# Patient Record
Sex: Female | Born: 1985 | Hispanic: Yes | Marital: Single | Smoking: Current some day smoker
Health system: Southern US, Community
[De-identification: ages and names within clinical notes are randomized; demographics above are authoritative.]

---

## 2017-07-23 ENCOUNTER — Encounter (HOSPITAL_COMMUNITY): Payer: Self-pay | Admitting: Emergency Medicine

## 2017-07-23 ENCOUNTER — Emergency Department (HOSPITAL_COMMUNITY): Payer: Self-pay

## 2017-07-23 ENCOUNTER — Emergency Department (HOSPITAL_COMMUNITY)
Admission: EM | Admit: 2017-07-23 | Discharge: 2017-07-23 | Disposition: A | Discharge status: Home / Self Care | Payer: Self-pay | Attending: Emergency Medicine | Admitting: Emergency Medicine

## 2017-07-23 DIAGNOSIS — Z5321 Procedure and treatment not carried out due to patient leaving prior to being seen by health care provider: Secondary | ICD-10-CM | POA: Insufficient documentation

## 2017-07-23 DIAGNOSIS — R51 Headache: Secondary | ICD-10-CM | POA: Insufficient documentation

## 2017-07-23 LAB — POC URINE PREG, ED: Preg Test, Ur: NEGATIVE

## 2017-07-23 MED ORDER — OXYCODONE-ACETAMINOPHEN 5-325 MG PO TABS
1.0000 | ORAL_TABLET | ORAL | Status: DC | PRN
  Administered 2017-07-23: 1 via ORAL
  Filled 2017-07-23: qty 1
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
## 2017-07-23 NOTE — ED Notes (Signed)
gpd at bedside,  Delay on collecting  urine.

## 2019-05-05 IMAGING — CR DG FOREARM 2V*R*
2 series · 2 of 2 positions shown · non-contrast
Comparison: None.

CLINICAL DATA: Fall

EXAM:
RIGHT FOREARM - 2 VIEW; RIGHT WRIST - COMPLETE 3+ VIEW

[forearm ap]
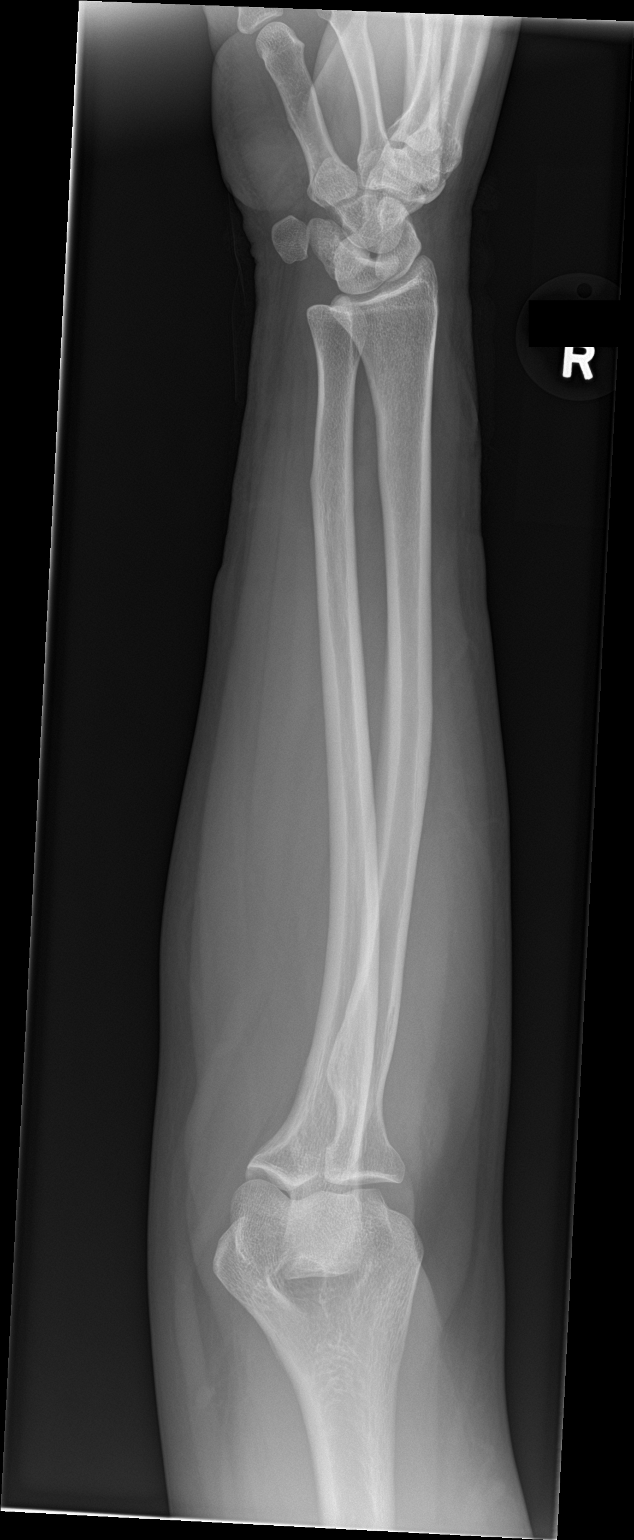

[forearm lat]
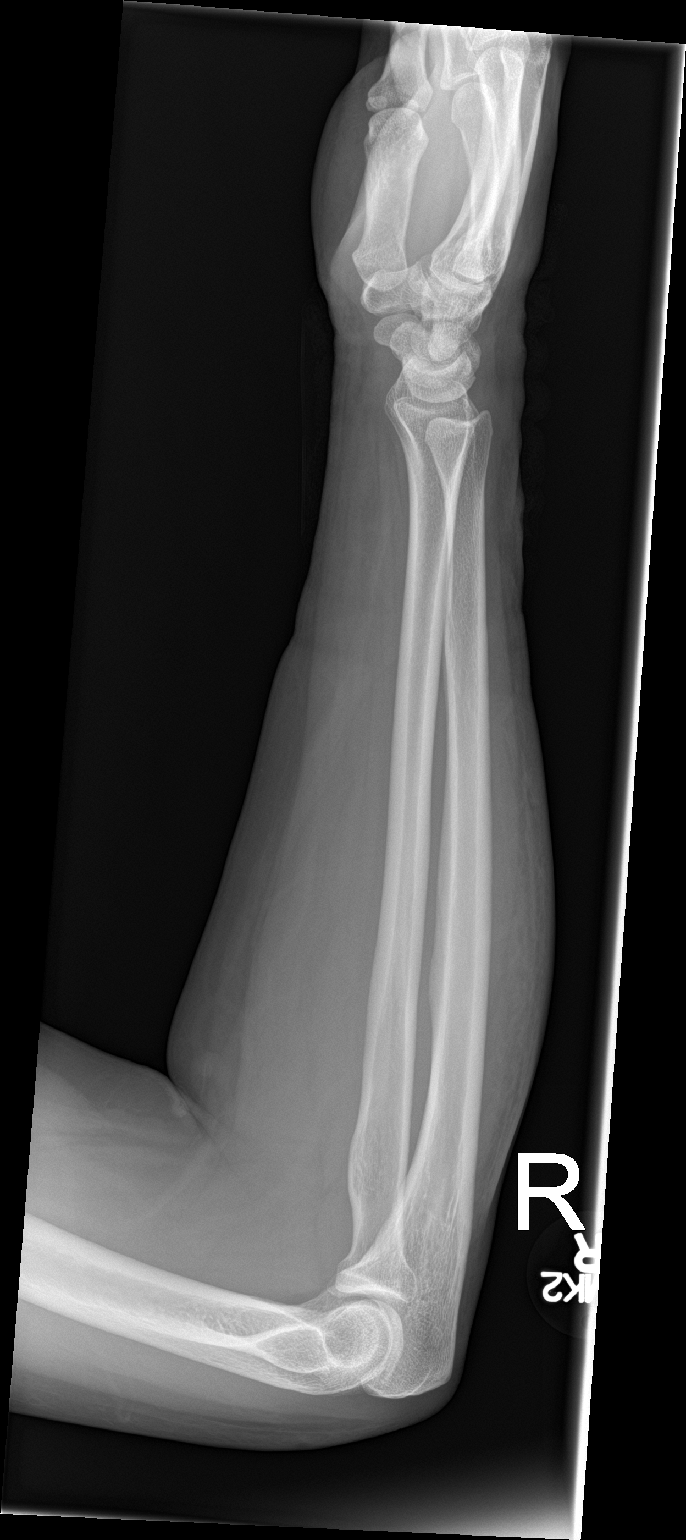

[2 of 2 positions shown; findings below may reference images not displayed]

FINDINGS: There is no fracture or dislocation of the right forearm or right
wrist. Joint spaces are normal. No radiopaque foreign body.
IMPRESSION: No fracture or dislocation of the right forearm and wrist.

## 2021-03-06 ENCOUNTER — Other Ambulatory Visit: Payer: Self-pay
